# Patient Record
Sex: Female | Born: 1980 | Hispanic: Refuse to answer | Marital: Married | Smoking: Never smoker
Health system: Southern US, Community
[De-identification: ages and names within clinical notes are randomized; demographics above are authoritative.]

## PROBLEM LIST (undated history)

## (undated) DIAGNOSIS — F329 Major depressive disorder, single episode, unspecified: Secondary | ICD-10-CM

## (undated) DIAGNOSIS — F32A Depression, unspecified: Secondary | ICD-10-CM

## (undated) DIAGNOSIS — F419 Anxiety disorder, unspecified: Secondary | ICD-10-CM

---

## 2016-09-25 ENCOUNTER — Ambulatory Visit
Admission: EM | Admit: 2016-09-25 | Discharge: 2016-09-25 | Disposition: A | Discharge status: Home / Self Care | Attending: Emergency Medicine | Admitting: Emergency Medicine

## 2016-09-25 ENCOUNTER — Ambulatory Visit (INDEPENDENT_AMBULATORY_CARE_PROVIDER_SITE_OTHER)

## 2016-09-25 DIAGNOSIS — S161XXA Strain of muscle, fascia and tendon at neck level, initial encounter: Secondary | ICD-10-CM

## 2016-09-25 HISTORY — DX: Depression, unspecified: F32.A

## 2016-09-25 HISTORY — DX: Anxiety disorder, unspecified: F41.9

## 2016-09-25 HISTORY — DX: Major depressive disorder, single episode, unspecified: F32.9

## 2016-09-25 MED ORDER — METHOCARBAMOL 750 MG PO TABS: 750.0000 mg | ORAL_TABLET | ORAL | 0 refills | Status: AC

## 2016-09-25 MED ORDER — DICLOFENAC SODIUM 75 MG PO TBEC: 75.0000 mg | DELAYED_RELEASE_TABLET | Freq: Two times a day (BID) | ORAL | 0 refills | Status: AC

## 2016-09-25 MED ORDER — HYDROCODONE-ACETAMINOPHEN 5-325 MG PO TABS: 2.0000 | ORAL_TABLET | ORAL | 0 refills | Status: AC | PRN

## 2016-09-25 NOTE — Discharge Instructions (Signed)
People tend to feel worse over the next several days, but most people are back to normal in 1 week. A small number of people will have persistent pain for up to six weeks. Take the diclofenac on a regular basis as directed. Take 1 g of Tylenol with it.. You may take up to 1 gram of tylenol 4 times a day. This with the NSAID is an extremely effective combination for pain. Do not take the norco if you are taking the tylenol, as they both have tylenol in them, and too much can hurt your liver. Do not exceed 4 grams of tylenol per day from all sources.    Some people may require physical therapy. Early range of motion neck exercises has been shown to speed recovery. Start doing them as soon as possible. Start doing small range and amplitude movements of your neck, first in one direction, then the other. Repeat this 10 times in each direction every hour while awake. Do these to the maximum comfortable range. You may do this sitting up or lying down.  Go to www.goodrx.com to look up your medications. This will give you a list of where you can find your prescriptions at the most affordable prices. Or ask the pharmacist what the cash price is. This can be less expensive than what you would pay with insurance.
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
